# Patient Record
Sex: Female | Born: 1990 | Race: White | Hispanic: No | Marital: Married | Smoking: Current every day smoker
Health system: Southern US, Community
[De-identification: ages and names within clinical notes are randomized; demographics above are authoritative.]

## PROBLEM LIST (undated history)

## (undated) DIAGNOSIS — G43909 Migraine, unspecified, not intractable, without status migrainosus: Secondary | ICD-10-CM

## (undated) DIAGNOSIS — J45909 Unspecified asthma, uncomplicated: Secondary | ICD-10-CM

## (undated) DIAGNOSIS — F419 Anxiety disorder, unspecified: Secondary | ICD-10-CM

## (undated) HISTORY — PX: TONSILLECTOMY: SUR1361

## (undated) HISTORY — DX: Anxiety disorder, unspecified: F41.9

## (undated) HISTORY — DX: Migraine, unspecified, not intractable, without status migrainosus: G43.909

---
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
## 2009-02-01 ENCOUNTER — Observation Stay (HOSPITAL_COMMUNITY): Admission: AD | Admit: 2009-02-01 | Discharge: 2009-02-02 | Payer: Self-pay | Admitting: Obstetrics and Gynecology

## 2009-02-12 ENCOUNTER — Inpatient Hospital Stay (HOSPITAL_COMMUNITY): Admission: AD | Admit: 2009-02-12 | Discharge: 2009-02-14 | Payer: Self-pay | Admitting: Obstetrics and Gynecology

## 2009-02-12 ENCOUNTER — Encounter (INDEPENDENT_AMBULATORY_CARE_PROVIDER_SITE_OTHER): Payer: Self-pay | Admitting: Obstetrics and Gynecology

## 2009-02-21 ENCOUNTER — Inpatient Hospital Stay (HOSPITAL_COMMUNITY): Admission: AD | Admit: 2009-02-21 | Discharge: 2009-02-24 | Payer: Self-pay | Admitting: Obstetrics and Gynecology

## 2009-02-26 ENCOUNTER — Emergency Department (HOSPITAL_COMMUNITY): Admission: EM | Admit: 2009-02-26 | Discharge: 2009-02-26 | Payer: Self-pay | Admitting: Emergency Medicine

## 2010-04-22 ENCOUNTER — Encounter: Payer: Self-pay | Admitting: Obstetrics & Gynecology

## 2010-07-05 LAB — URINALYSIS, ROUTINE W REFLEX MICROSCOPIC
Bilirubin Urine: NEGATIVE
Bilirubin Urine: NEGATIVE
Bilirubin Urine: NEGATIVE
Glucose, UA: NEGATIVE mg/dL
Glucose, UA: NEGATIVE mg/dL
Glucose, UA: NEGATIVE mg/dL
Hgb urine dipstick: NEGATIVE
Ketones, ur: NEGATIVE mg/dL
Ketones, ur: NEGATIVE mg/dL
Ketones, ur: NEGATIVE mg/dL
Ketones, ur: 15 mg/dL — AB
Leukocytes, UA: NEGATIVE
Nitrite: NEGATIVE
Nitrite: NEGATIVE
Nitrite: NEGATIVE
Protein, ur: NEGATIVE mg/dL
Protein, ur: 100 mg/dL — AB
Protein, ur: >300 mg/dL — AB
Protein, ur: 30 mg/dL — AB
Specific Gravity, Urine: 1.015 (ref 1.005–1.030)
Specific Gravity, Urine: 1.02 (ref 1.005–1.030)
Specific Gravity, Urine: 1.025 (ref 1.005–1.030)
Urobilinogen, UA: 0.2 mg/dL (ref 0.0–1.0)
Urobilinogen, UA: 0.2 mg/dL (ref 0.0–1.0)
Urobilinogen, UA: 0.2 mg/dL (ref 0.0–1.0)
Urobilinogen, UA: 0.2 mg/dL (ref 0.0–1.0)
pH: 6 (ref 5.0–8.0)
pH: 6.5 (ref 5.0–8.0)
pH: 7 (ref 5.0–8.0)

## 2010-07-05 LAB — ABO/RH: ABO/RH(D): O POS

## 2010-07-05 LAB — CBC
HCT: 42 % (ref 36.0–46.0)
HCT: 39.2 % (ref 36.0–46.0)
HCT: 42.9 % (ref 36.0–46.0)
HCT: 33 % — ABNORMAL LOW (ref 36.0–46.0)
HCT: 37.3 % (ref 36.0–46.0)
Hemoglobin: 14.5 g/dL (ref 12.0–15.0)
Hemoglobin: 13.3 g/dL (ref 12.0–15.0)
Hemoglobin: 14.6 g/dL (ref 12.0–15.0)
Hemoglobin: 12.9 g/dL (ref 12.0–15.0)
Hemoglobin: 11.4 g/dL — ABNORMAL LOW (ref 12.0–15.0)
MCHC: 34.6 g/dL (ref 30.0–36.0)
MCHC: 33.8 g/dL (ref 30.0–36.0)
MCHC: 33.9 g/dL (ref 30.0–36.0)
MCHC: 35.5 g/dL (ref 30.0–36.0)
MCHC: 34.6 g/dL (ref 30.0–36.0)
MCHC: 34.6 g/dL (ref 30.0–36.0)
MCV: 96.1 fL (ref 78.0–100.0)
MCV: 95.3 fL (ref 78.0–100.0)
MCV: 96.1 fL (ref 78.0–100.0)
MCV: 95.5 fL (ref 78.0–100.0)
Platelets: 338 10*3/uL (ref 150–400)
Platelets: 303 10*3/uL (ref 150–400)
Platelets: 334 10*3/uL (ref 150–400)
Platelets: 132 10*3/uL — ABNORMAL LOW (ref 150–400)
Platelets: 181 10*3/uL (ref 150–400)
Platelets: 196 10*3/uL (ref 150–400)
RBC: 4.37 MIL/uL (ref 3.87–5.11)
RBC: 4.08 MIL/uL (ref 3.87–5.11)
RBC: 4.5 MIL/uL (ref 3.87–5.11)
RBC: 3.88 MIL/uL (ref 3.87–5.11)
RBC: 4.21 MIL/uL (ref 3.87–5.11)
RDW: 13.1 % (ref 11.5–15.5)
RDW: 13.2 % (ref 11.5–15.5)
RDW: 13.5 % (ref 11.5–15.5)
RDW: 13.4 % (ref 11.5–15.5)
RDW: 13.4 % (ref 11.5–15.5)
WBC: 10 10*3/uL (ref 4.0–10.5)
WBC: 7.9 10*3/uL (ref 4.0–10.5)
WBC: 9.1 10*3/uL (ref 4.0–10.5)
WBC: 21.4 10*3/uL — ABNORMAL HIGH (ref 4.0–10.5)

## 2010-07-05 LAB — COMPREHENSIVE METABOLIC PANEL
ALT: 17 U/L (ref 0–35)
ALT: 14 U/L (ref 0–35)
ALT: 17 U/L (ref 0–35)
ALT: 12 U/L (ref 0–35)
ALT: 12 U/L (ref 0–35)
AST: 28 U/L (ref 0–37)
AST: 18 U/L (ref 0–37)
AST: 19 U/L (ref 0–37)
AST: 34 U/L (ref 0–37)
Albumin: 3.8 g/dL (ref 3.5–5.2)
Albumin: 2.8 g/dL — ABNORMAL LOW (ref 3.5–5.2)
Albumin: 3.4 g/dL — ABNORMAL LOW (ref 3.5–5.2)
Albumin: 2.7 g/dL — ABNORMAL LOW (ref 3.5–5.2)
Alkaline Phosphatase: 86 U/L (ref 39–117)
Alkaline Phosphatase: 84 U/L (ref 39–117)
Alkaline Phosphatase: 98 U/L (ref 39–117)
Alkaline Phosphatase: 144 U/L — ABNORMAL HIGH (ref 39–117)
Alkaline Phosphatase: 140 U/L — ABNORMAL HIGH (ref 39–117)
BUN: 14 mg/dL (ref 6–23)
BUN: 6 mg/dL (ref 6–23)
BUN: 9 mg/dL (ref 6–23)
BUN: 5 mg/dL — ABNORMAL LOW (ref 6–23)
CO2: 24 mEq/L (ref 19–32)
CO2: 25 mEq/L (ref 19–32)
CO2: 28 mEq/L (ref 19–32)
CO2: 20 mEq/L (ref 19–32)
Calcium: 9.2 mg/dL (ref 8.4–10.5)
Calcium: 7 mg/dL — ABNORMAL LOW (ref 8.4–10.5)
Calcium: 8.7 mg/dL (ref 8.4–10.5)
Calcium: 8.5 mg/dL (ref 8.4–10.5)
Chloride: 105 mEq/L (ref 96–112)
Chloride: 103 mEq/L (ref 96–112)
Chloride: 105 mEq/L (ref 96–112)
Chloride: 106 mEq/L (ref 96–112)
Creatinine, Ser: 0.64 mg/dL (ref 0.4–1.2)
Creatinine, Ser: 0.6 mg/dL (ref 0.4–1.2)
Creatinine, Ser: 0.63 mg/dL (ref 0.4–1.2)
GFR calc Af Amer: >60 mL/min (ref >60)
GFR calc Af Amer: >60 mL/min (ref >60)
GFR calc Af Amer: >60 mL/min (ref >60)
GFR calc Af Amer: >60 mL/min (ref >60)
GFR calc non Af Amer: >60 mL/min (ref >60)
GFR calc non Af Amer: >60 mL/min (ref >60)
GFR calc non Af Amer: >60 mL/min (ref >60)
GFR calc non Af Amer: >60 mL/min (ref >60)
Glucose, Bld: 102 mg/dL — ABNORMAL HIGH (ref 70–99)
Glucose, Bld: 86 mg/dL (ref 70–99)
Glucose, Bld: 88 mg/dL (ref 70–99)
Glucose, Bld: 97 mg/dL (ref 70–99)
Potassium: 4.8 mEq/L (ref 3.5–5.1)
Potassium: 3.8 mEq/L (ref 3.5–5.1)
Potassium: 4.4 mEq/L (ref 3.5–5.1)
Potassium: 3.4 mEq/L — ABNORMAL LOW (ref 3.5–5.1)
Potassium: 4 mEq/L (ref 3.5–5.1)
Sodium: 137 mEq/L (ref 135–145)
Sodium: 135 mEq/L (ref 135–145)
Sodium: 139 mEq/L (ref 135–145)
Sodium: 137 mEq/L (ref 135–145)
Sodium: 134 mEq/L — ABNORMAL LOW (ref 135–145)
Total Bilirubin: 0.8 mg/dL (ref 0.3–1.2)
Total Bilirubin: 0.2 mg/dL — ABNORMAL LOW (ref 0.3–1.2)
Total Bilirubin: 0.5 mg/dL (ref 0.3–1.2)
Total Bilirubin: 0.4 mg/dL (ref 0.3–1.2)
Total Bilirubin: 0.5 mg/dL (ref 0.3–1.2)
Total Protein: 6.6 g/dL (ref 6.0–8.3)
Total Protein: 5.3 g/dL — ABNORMAL LOW (ref 6.0–8.3)
Total Protein: 6.4 g/dL (ref 6.0–8.3)
Total Protein: 5.4 g/dL — ABNORMAL LOW (ref 6.0–8.3)

## 2010-07-05 LAB — URINE MICROSCOPIC-ADD ON

## 2010-07-05 LAB — DIFFERENTIAL
Basophils Absolute: 0.1 10*3/uL (ref 0.0–0.1)
Basophils Relative: 1 % (ref 0–1)
Eosinophils Absolute: 0.2 10*3/uL (ref 0.0–0.7)
Eosinophils Relative: 2 % (ref 0–5)
Lymphocytes Relative: 25 % (ref 12–46)
Lymphs Abs: 2.5 10*3/uL (ref 0.7–4.0)
Monocytes Absolute: 0.8 10*3/uL (ref 0.1–1.0)
Monocytes Relative: 8 % (ref 3–12)
Neutro Abs: 6.4 10*3/uL (ref 1.7–7.7)
Neutrophils Relative %: 64 % (ref 43–77)

## 2010-07-05 LAB — POCT I-STAT, CHEM 8
Hemoglobin: 12.2 g/dL (ref 12.0–15.0)
Sodium: 138 mEq/L (ref 135–145)
TCO2: 19 mmol/L (ref 0–100)

## 2010-07-05 LAB — URINE CULTURE
Colony Count: 25000
Colony Count: 55000

## 2010-07-05 LAB — GLUCOSE, CAPILLARY: Glucose-Capillary: 97 mg/dL (ref 70–99)

## 2010-07-05 LAB — PROTIME-INR
INR: 0.98 (ref 0.00–1.49)
Prothrombin Time: 12.9 seconds (ref 11.6–15.2)
Prothrombin Time: 13.2 seconds (ref 11.6–15.2)

## 2010-07-05 LAB — TYPE AND SCREEN: Antibody Screen: NEGATIVE

## 2010-07-05 LAB — URIC ACID: Uric Acid, Serum: 5.6 mg/dL (ref 2.4–7.0)

## 2010-07-05 LAB — MAGNESIUM: Magnesium: 5.7 mg/dL — ABNORMAL HIGH (ref 1.5–2.5)

## 2010-07-05 LAB — LACTATE DEHYDROGENASE: LDH: 219 U/L (ref 94–250)

## 2010-07-05 LAB — RPR: RPR Ser Ql: NONREACTIVE
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
## 2012-05-05 ENCOUNTER — Encounter (HOSPITAL_COMMUNITY): Payer: Self-pay | Admitting: Emergency Medicine

## 2012-05-05 ENCOUNTER — Emergency Department (HOSPITAL_COMMUNITY)
Admission: EM | Admit: 2012-05-05 | Discharge: 2012-05-05 | Disposition: A | Discharge status: Home / Self Care | Payer: Self-pay | Attending: Emergency Medicine | Admitting: Emergency Medicine

## 2012-05-05 DIAGNOSIS — R110 Nausea: Secondary | ICD-10-CM | POA: Insufficient documentation

## 2012-05-05 DIAGNOSIS — F17200 Nicotine dependence, unspecified, uncomplicated: Secondary | ICD-10-CM | POA: Insufficient documentation

## 2012-05-05 DIAGNOSIS — R300 Dysuria: Secondary | ICD-10-CM | POA: Insufficient documentation

## 2012-05-05 DIAGNOSIS — L293 Anogenital pruritus, unspecified: Secondary | ICD-10-CM | POA: Insufficient documentation

## 2012-05-05 DIAGNOSIS — R3915 Urgency of urination: Secondary | ICD-10-CM | POA: Insufficient documentation

## 2012-05-05 DIAGNOSIS — R350 Frequency of micturition: Secondary | ICD-10-CM | POA: Insufficient documentation

## 2012-05-05 DIAGNOSIS — N390 Urinary tract infection, site not specified: Secondary | ICD-10-CM

## 2012-05-05 DIAGNOSIS — Z79899 Other long term (current) drug therapy: Secondary | ICD-10-CM | POA: Insufficient documentation

## 2012-05-05 DIAGNOSIS — J45909 Unspecified asthma, uncomplicated: Secondary | ICD-10-CM | POA: Insufficient documentation

## 2012-05-05 DIAGNOSIS — Z3202 Encounter for pregnancy test, result negative: Secondary | ICD-10-CM | POA: Insufficient documentation

## 2012-05-05 DIAGNOSIS — R109 Unspecified abdominal pain: Secondary | ICD-10-CM | POA: Insufficient documentation

## 2012-05-05 DIAGNOSIS — B9689 Other specified bacterial agents as the cause of diseases classified elsewhere: Secondary | ICD-10-CM

## 2012-05-05 DIAGNOSIS — N760 Acute vaginitis: Secondary | ICD-10-CM | POA: Insufficient documentation

## 2012-05-05 HISTORY — DX: Unspecified asthma, uncomplicated: J45.909

## 2012-05-05 LAB — URINALYSIS, ROUTINE W REFLEX MICROSCOPIC
Bilirubin Urine: NEGATIVE
Hgb urine dipstick: NEGATIVE
Ketones, ur: NEGATIVE mg/dL
Nitrite: NEGATIVE
Urobilinogen, UA: 0.2 mg/dL (ref 0.0–1.0)

## 2012-05-05 LAB — WET PREP, GENITAL

## 2012-05-05 LAB — URINE MICROSCOPIC-ADD ON

## 2012-05-05 MED ORDER — METRONIDAZOLE 500 MG PO TABS: 500.0000 mg | ORAL_TABLET | Freq: Two times a day (BID) | ORAL | Status: DC

## 2012-05-05 MED ORDER — NITROFURANTOIN MONOHYD MACRO 100 MG PO CAPS: 100.0000 mg | ORAL_CAPSULE | Freq: Two times a day (BID) | ORAL | Status: DC

## 2012-05-05 NOTE — ED Notes (Signed)
Pt reports vaginal discharge and itching X2w, also reports bilat flank and abd pain, no vomiting, no fever, c/o nausea, also chills and, NAD

## 2012-05-05 NOTE — ED Notes (Signed)
Did not complete pregnancy urine.  It was charted under my name.

## 2012-05-05 NOTE — ED Provider Notes (Signed)
History     CSN: [id number]  Arrival date & time 05/05/12  1007   First MD Initiated Contact with Patient 05/05/12 1010      Chief Complaint  Patient presents with  . Vaginal Discharge  . Vaginal Itching    (Consider location/radiation/quality/duration/timing/severity/associated sxs/prior treatment) HPI Comments: Patient presents today with a chief complaint of vaginal discharge and vaginal itching for the past 2 weeks.  She reports that the discharge is thick and white in color.  She is also complaining of suprapubic abdominal pain.  Pain does not radiate.  Pain gradually worsening.  She has not taken anything for her symptoms.  She has also had dysuria, increased urinary frequency, and urgency over the past week.  She denies fever or chills.  She has had intermittent nausea, but no vomiting.  She has had intermittent bilateral flank pain.  No flank pain at this time.  No hematuria.  No diarrhea.  She is having regular bowel movements.  She is currently sexually active with her husband.  No prior history of STD's.  No history of kidney stones.  She does have history of UTI's.   The history is provided by the patient.    Past Medical History  Diagnosis Date  . Asthma     Past Surgical History  Procedure Date  . Tonsillectomy     No family history on file.  History  Substance Use Topics  . Smoking status: Current Every Day Smoker  . Smokeless tobacco: Not on file  . Alcohol Use: No    OB History    Grav Para Term Preterm Abortions TAB SAB Ect Mult Living                  Review of Systems  Constitutional: Negative for fever and chills.  Gastrointestinal: Positive for nausea and abdominal pain. Negative for vomiting, diarrhea, constipation, blood in stool and abdominal distention.  Genitourinary: Positive for dysuria, urgency, frequency and vaginal discharge. Negative for vaginal bleeding, difficulty urinating and vaginal pain.       Vaginal itching  Neurological:  Negative for headaches.  All other systems reviewed and are negative.    Allergies  Percocet  Home Medications   Current Outpatient Rx  Name  Route  Sig  Dispense  Refill  . MENSTRUAL PAIN RELIEF PO   Oral   Take 1 tablet by mouth daily as needed. For menstrual cramps.         Marland Kitchen CIPROFLOXACIN HCL 500 MG PO TABS   Oral   Take 500 mg by mouth daily. Started Monday 04/28/12.         Marland Kitchen NAPROXEN SODIUM 220 MG PO TABS   Oral   Take 440 mg by mouth 2 (two) times daily with a meal. For pain.           BP 134/89  Pulse 105  Temp 97.4 F (36.3 C) (Oral)  Resp 16  SpO2 99%  Physical Exam  Nursing note and vitals reviewed. Constitutional: She appears well-developed and well-nourished. No distress.  HENT:  Head: Normocephalic and atraumatic.  Mouth/Throat: Oropharynx is clear and moist.  Neck: Normal range of motion. Neck supple.  Cardiovascular: Normal rate, regular rhythm and normal heart sounds.   Pulmonary/Chest: Effort normal and breath sounds normal.  Abdominal: Soft. Normal appearance and bowel sounds are normal. She exhibits no distension and no mass. There is no rigidity, no rebound, no guarding and no CVA tenderness.  Mild suprapubic tenderness to palpation  Musculoskeletal: Normal range of motion.  Neurological: She is alert.  Skin: Skin is warm and dry. She is not diaphoretic.  Psychiatric: She has a normal mood and affect.    ED Course  Procedures (including critical care time)  Labs Reviewed  URINALYSIS, ROUTINE W REFLEX MICROSCOPIC - Abnormal; Notable for the following:    APPearance CLOUDY (*)     Leukocytes, UA LARGE (*)     All other components within normal limits  URINE MICROSCOPIC-ADD ON - Abnormal; Notable for the following:    Squamous Epithelial / LPF MANY (*)     Bacteria, UA MANY (*)     All other components within normal limits  POCT PREGNANCY, URINE  URINE CULTURE  WET PREP, GENITAL  GC/CHLAMYDIA PROBE AMP   No results  found.   No diagnosis found.  Patient able to tolerate PO liquids.    MDM  Patient presenting with urinary symptoms along with suprapubic abdominal pain.  UA showing a UTI.  Urine sent for culture.  Patient with no CVA tenderness on exam.  Patient afebrile.  Therefore, feel that Pyelonephritis is unlikely.  Patient given Rx for Macrobid.  Wet prep shows BV.  Patient also treated with Flagyl.  Return precautions given to the patient.  Patient in agreement with the plan.        Pascal Lux [place], PA-C 05/05/12 1945

## 2012-05-05 NOTE — ED Notes (Signed)
Care transferred and report given to Alan, RN  

## 2012-05-06 LAB — URINE CULTURE: Culture: NO GROWTH

## 2012-05-06 LAB — GC/CHLAMYDIA PROBE AMP: GC Probe RNA: NEGATIVE

## 2012-05-06 NOTE — ED Provider Notes (Signed)
Medical screening examination/treatment/procedure(s) were performed by non-physician practitioner and as supervising physician I was immediately available for consultation/collaboration.   Flint Melter, MD 05/06/12 1550

## 2014-02-08 ENCOUNTER — Other Ambulatory Visit: Payer: Self-pay | Admitting: Obstetrics and Gynecology

## 2014-02-09 LAB — CYTOLOGY - PAP

## 2016-11-13 ENCOUNTER — Encounter: Payer: Self-pay | Admitting: Neurology

## 2016-11-13 ENCOUNTER — Encounter (HOSPITAL_COMMUNITY): Payer: Self-pay | Admitting: Emergency Medicine

## 2016-11-13 ENCOUNTER — Emergency Department (HOSPITAL_COMMUNITY): Payer: BLUE CROSS/BLUE SHIELD

## 2016-11-13 ENCOUNTER — Emergency Department (HOSPITAL_COMMUNITY)
Admission: EM | Admit: 2016-11-13 | Discharge: 2016-11-13 | Disposition: A | Discharge status: Home / Self Care | Payer: BLUE CROSS/BLUE SHIELD | Attending: Emergency Medicine | Admitting: Emergency Medicine

## 2016-11-13 DIAGNOSIS — R51 Headache: Secondary | ICD-10-CM | POA: Insufficient documentation

## 2016-11-13 DIAGNOSIS — Z79899 Other long term (current) drug therapy: Secondary | ICD-10-CM | POA: Diagnosis not present

## 2016-11-13 DIAGNOSIS — J45909 Unspecified asthma, uncomplicated: Secondary | ICD-10-CM | POA: Insufficient documentation

## 2016-11-13 DIAGNOSIS — R519 Headache, unspecified: Secondary | ICD-10-CM

## 2016-11-13 DIAGNOSIS — F17200 Nicotine dependence, unspecified, uncomplicated: Secondary | ICD-10-CM | POA: Diagnosis not present

## 2016-11-13 LAB — COMPREHENSIVE METABOLIC PANEL
ALBUMIN: 4.2 g/dL (ref 3.5–5.0)
ALT: 26 U/L (ref 14–54)
AST: 24 U/L (ref 15–41)
Alkaline Phosphatase: 65 U/L (ref 38–126)
Anion gap: 9 (ref 5–15)
BILIRUBIN TOTAL: 0.4 mg/dL (ref 0.3–1.2)
BUN: 7 mg/dL (ref 6–20)
CO2: 21 mmol/L — ABNORMAL LOW (ref 22–32)
CREATININE: 0.77 mg/dL (ref 0.44–1.00)
Calcium: 9.1 mg/dL (ref 8.9–10.3)
Chloride: 106 mmol/L (ref 101–111)
GFR calc Af Amer: >60 mL/min (ref >60)
GFR calc non Af Amer: >60 mL/min (ref >60)
GLUCOSE: 105 mg/dL — AB (ref 65–99)
Potassium: 3.6 mmol/L (ref 3.5–5.1)
Sodium: 136 mmol/L (ref 135–145)
TOTAL PROTEIN: 6.7 g/dL (ref 6.5–8.1)

## 2016-11-13 LAB — CBC WITH DIFFERENTIAL/PLATELET
BASOS PCT: 1 %
Basophils Absolute: 0 10*3/uL (ref 0.0–0.1)
Eosinophils Absolute: 0.1 10*3/uL (ref 0.0–0.7)
Eosinophils Relative: 1 %
HEMATOCRIT: 39.6 % (ref 36.0–46.0)
HEMOGLOBIN: 13.7 g/dL (ref 12.0–15.0)
LYMPHS PCT: 39 %
Lymphs Abs: 3.3 10*3/uL (ref 0.7–4.0)
MCH: 29.1 pg (ref 26.0–34.0)
MCHC: 34.6 g/dL (ref 30.0–36.0)
MCV: 84.3 fL (ref 78.0–100.0)
MONOS PCT: 7 %
Monocytes Absolute: 0.6 10*3/uL (ref 0.1–1.0)
NEUTROS ABS: 4.4 10*3/uL (ref 1.7–7.7)
Neutrophils Relative %: 52 %
Platelets: 357 10*3/uL (ref 150–400)
RBC: 4.7 MIL/uL (ref 3.87–5.11)
RDW: 12.5 % (ref 11.5–15.5)
WBC: 8.5 10*3/uL (ref 4.0–10.5)

## 2016-11-13 LAB — URINALYSIS, ROUTINE W REFLEX MICROSCOPIC
BACTERIA UA: NONE SEEN
Bilirubin Urine: NEGATIVE
Glucose, UA: NEGATIVE mg/dL
KETONES UR: NEGATIVE mg/dL
Nitrite: NEGATIVE
PH: 7 (ref 5.0–8.0)
Protein, ur: NEGATIVE mg/dL
Specific Gravity, Urine: 1.003 — ABNORMAL LOW (ref 1.005–1.030)

## 2016-11-13 LAB — I-STAT BETA HCG BLOOD, ED (MC, WL, AP ONLY): I-stat hCG, quantitative: <5 m[IU]/mL (ref <5)

## 2016-11-13 LAB — D-DIMER, QUANTITATIVE: D-Dimer, Quant: <0.27 ug/mL-FEU (ref 0.00–0.50)

## 2016-11-13 MED ORDER — DIPHENHYDRAMINE HCL 50 MG/ML IJ SOLN
25.0000 mg | Freq: Once | INTRAMUSCULAR | Status: AC
  Administered 2016-11-13: 25 mg via INTRAVENOUS
  Filled 2016-11-13: qty 1

## 2016-11-13 MED ORDER — KETOROLAC TROMETHAMINE 15 MG/ML IJ SOLN
15.0000 mg | Freq: Once | INTRAMUSCULAR | Status: AC
  Administered 2016-11-13: 15 mg via INTRAVENOUS
  Filled 2016-11-13: qty 1

## 2016-11-13 MED ORDER — METOCLOPRAMIDE HCL 5 MG/ML IJ SOLN
10.0000 mg | Freq: Once | INTRAMUSCULAR | Status: AC
  Administered 2016-11-13: 10 mg via INTRAVENOUS
  Filled 2016-11-13: qty 2

## 2016-11-13 NOTE — ED Provider Notes (Signed)
Sign out from [employer] Browning, PA-C at shift change  Headache x 3 weeks; weird nonspecific neuro complaints, HA cocktail; also chest pain and tachycardia dx by PCP started on BB; D-dimer pending; if normal, d/c home to neuro for further evaluation  D-dimer is negative and patient's headache improved after headache cocktail. We'll discharge patient home with follow-up to neurology here and given resources. Patient discharged in satisfactory condition.    Destiny HolesLaw, Destiny Oyster M, PA-C 11/13/16 [id number]    Destiny Friedman, Donald, MD 11/14/16 [phone]

## 2016-11-13 NOTE — ED Notes (Addendum)
Brought pt back to room and pt stated that while she was waiting for a room she began having chest pain and became nauseous. Pt stated that symptoms are still there just not as bad. Pt hooked up to 12 lead and ekg performed

## 2016-11-13 NOTE — ED Triage Notes (Signed)
Pt c/o headache x's 3 weeks st's worse today with neck feeling stiff.  Pt also c/o pain behind bil knees and a tightening feeling in bil arms and legs.  Nausea without vomiting

## 2016-11-13 NOTE — ED Provider Notes (Signed)
[facility]-EMERGENCY DEPT Provider Note   CSN: [id number] Arrival date & time: 11/13/16  0129     History   Chief Complaint Chief Complaint  Patient presents with  . Headache    HPI Destiny Friedman is a 26 y.o. female.  Patient presents to the ED with a chief complaint of headache.  She reports having a headache x 3 weeks.  She states that it is throbbing and located behind her left eye.  This is a new problem.  She reports intermittent "tightness" in her arms and legs.  She also reports some intermittent numbness in her finger tips.  She states that she was recently diagnosed with tachycardia and was placed on Propranolol by her PCP.  She states that she has had some chest pain as well as nausea.  She reports that she is concerned about "blood clots."  She denies any history of PE/DVT.  Denies any lower extremity swelling.  There are no other associated symptoms.  She has tried taking migraine medication with no relief.   The history is provided by the patient. No language interpreter was used.    Past Medical History:  Diagnosis Date  . Asthma     There are no active problems to display for this patient.   Past Surgical History:  Procedure Laterality Date  . TONSILLECTOMY      OB History    No data available       Home Medications    Prior to Admission medications   Medication Sig Start Date End Date Taking? Authorizing Provider  busPIRone (BUSPAR) 15 MG tablet Take 15 mg by mouth 2 (two) times daily.   Yes [provider]  cephALEXin (KEFLEX) 500 MG capsule Take 500 mg by mouth 4 (four) times daily.   Yes [provider]  metoprolol tartrate (LOPRESSOR) 50 MG tablet Take 25 mg by mouth 2 (two) times daily.   Yes [provider]  topiramate (TOPAMAX) 25 MG tablet Take 25 mg by mouth 2 (two) times daily.   Yes [provider]  metroNIDAZOLE (FLAGYL) 500 MG tablet Take 1 tablet (500 mg total) by mouth 2 (two) times daily. Patient not  taking: Reported on 11/13/2016 05/05/12   Santiago GladLaisure, Heather, PA-C  nitrofurantoin, macrocrystal-monohydrate, (MACROBID) 100 MG capsule Take 1 capsule (100 mg total) by mouth 2 (two) times daily. Patient not taking: Reported on 11/13/2016 05/05/12   Santiago GladLaisure, Heather, PA-C    Family History No family history on file.  Social History Social History  Substance Use Topics  . Smoking status: Current Every Day Smoker  . Smokeless tobacco: Never Used  . Alcohol use No     Allergies   Percocet [oxycodone-acetaminophen]   Review of Systems Review of Systems  All other systems reviewed and are negative.    Physical Exam Updated Vital Signs BP (!) 118/92   Pulse 84   Temp 98.2 F (36.8 C) (Oral)   Resp 16   Ht 5\' 4"  (1.626 m)   Wt 71.7 kg (158 lb)   LMP 10/31/2016 (Exact Date)   SpO2 99%   BMI 27.12 kg/m   Physical Exam  Constitutional: She is oriented to person, place, and time. She appears well-developed and well-nourished.  HENT:  Head: Normocephalic and atraumatic.  Right Ear: External ear normal.  Left Ear: External ear normal.  Eyes: Pupils are equal, round, and reactive to light. Conjunctivae and EOM are normal.  Neck: Normal range of motion. Neck supple.  No pain with  neck flexion, no meningismus  Cardiovascular: Normal rate, regular rhythm and normal heart sounds.  Exam reveals no gallop and no friction rub.   No murmur heard. Pulmonary/Chest: Effort normal and breath sounds normal. No respiratory distress. She has no wheezes. She has no rales. She exhibits no tenderness.  Abdominal: Soft. She exhibits no distension and no mass. There is no tenderness. There is no rebound and no guarding.  Musculoskeletal: Normal range of motion. She exhibits no edema or tenderness.  Normal gait.  Neurological: She is alert and oriented to person, place, and time. She has normal reflexes.  CN 3-12 intact, normal finger to nose, no pronator drift, sensation and strength intact  bilaterally.  Skin: Skin is warm and dry.  Psychiatric: She has a normal mood and affect. Her behavior is normal. Judgment and thought content normal.  Nursing note and vitals reviewed.    ED Treatments / Results  Labs (all labs ordered are listed, but only abnormal results are displayed) Labs Reviewed  URINALYSIS, ROUTINE W REFLEX MICROSCOPIC - Abnormal; Notable for the following:       Result Value   Color, Urine STRAW (*)    Specific Gravity, Urine 1.003 (*)    Hgb urine dipstick SMALL (*)    Leukocytes, UA SMALL (*)    Squamous Epithelial / LPF 0-5 (*)    All other components within normal limits  COMPREHENSIVE METABOLIC PANEL - Abnormal; Notable for the following:    CO2 21 (*)    Glucose, Bld 105 (*)    All other components within normal limits  CBC WITH DIFFERENTIAL/PLATELET  D-DIMER, QUANTITATIVE (NOT AT [facility])  I-STAT BETA HCG BLOOD, ED ([facility], [facility], [facility] ONLY)    EKG  EKG Interpretation  Date/Time:  Tuesday November 13 2016 04:12:20 EDT Ventricular Rate:  81 PR Interval:    QRS Duration: 87 QT Interval:  368 QTC Calculation: 428 R Axis:   69 Text Interpretation:  Sinus rhythm Interpretation limited secondary to artifact Confirmed by Zadie Rhine ([id number]) on 11/13/2016 4:26:26 AM       Radiology No results found.  Procedures Procedures (including critical care time)  Medications Ordered in ED Medications  ketorolac (TORADOL) 15 MG/ML injection 15 mg (not administered)  diphenhydrAMINE (BENADRYL) injection 25 mg (not administered)  metoCLOPramide (REGLAN) injection 10 mg (not administered)     Initial Impression / Assessment and Plan / ED Course  I have reviewed the triage vital signs and the nursing notes.  Pertinent labs & imaging results that were available during my care of the patient were reviewed by me and considered in my medical decision making (see chart for details).     Patient with headache intermittently x 3 weeks.  Never had CT scan.   Will check CT today to rule out mass or tumor.    CT is normal.  Will give headache cocktail.  Patient has also had some chest pain and was recently diagnosed with tachycardia.  She is low risk for PE, but given her concern for PE, will check d-dimer.    Patient signed out to Law, PA-C, who will continue care.  Final Clinical Impressions(s) / ED Diagnoses   Final diagnoses:  None    New Prescriptions New Prescriptions   No medications on file     Roxy Horseman, Cordelia Poche 11/13/16 6045    Zadie Rhine, MD 11/13/16 [phone]

## 2016-11-13 NOTE — Discharge Instructions (Signed)
You need to be seen by a neurologist or headache specialist.  Your CT scan was normal today, but you may require additional workup in order to diagnose your headaches.  Please return to the ER if your symptoms worsen.

## 2016-11-13 NOTE — ED Notes (Signed)
Patient transported to CT 

## 2017-02-15 ENCOUNTER — Ambulatory Visit: Payer: BLUE CROSS/BLUE SHIELD | Admitting: Neurology

## 2018-12-25 IMAGING — CT CT HEAD W/O CM
4 series · 17 of 47 positions shown, 19 images · non-contrast
Comparison: None.

CLINICAL DATA: 26 y/o F; intermittent headache for 2 weeks with
some nausea.

EXAM:
CT HEAD WITHOUT CONTRAST
TECHNIQUE: Contiguous axial images were obtained from the base of the skull
through the vertex without intravenous contrast.

[Series 3: head wo · axial · 0.40mm/px · z∈[-144,-24]mm · 7 of 32 slices shown, 9 images]
[im 4/32  brain]
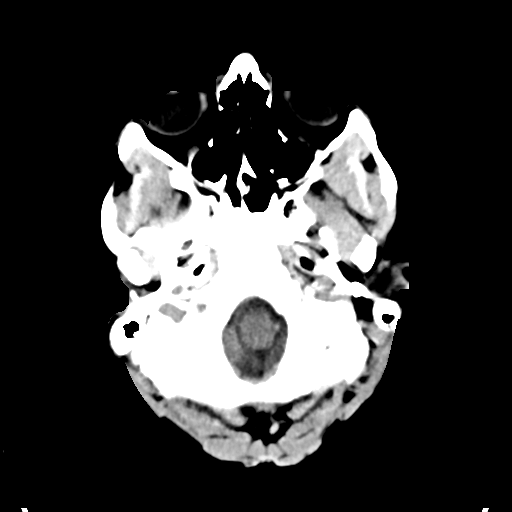
[im 4/32  bone]
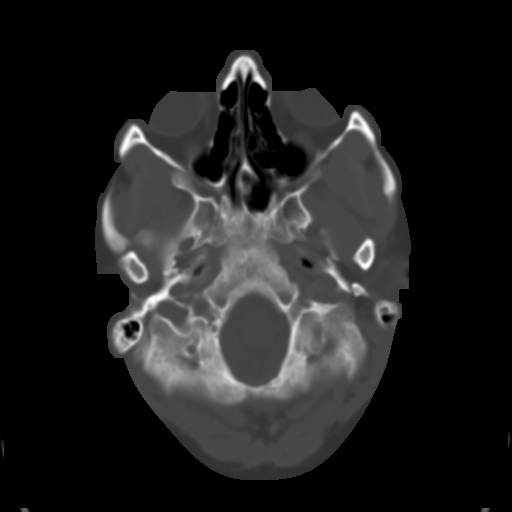
[im 8/32  brain]
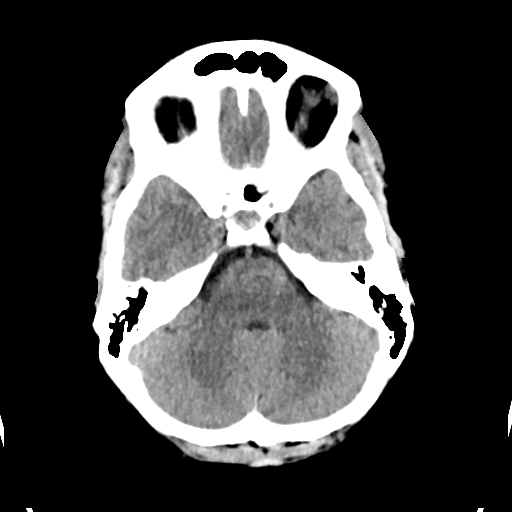
[im 12/32  brain]
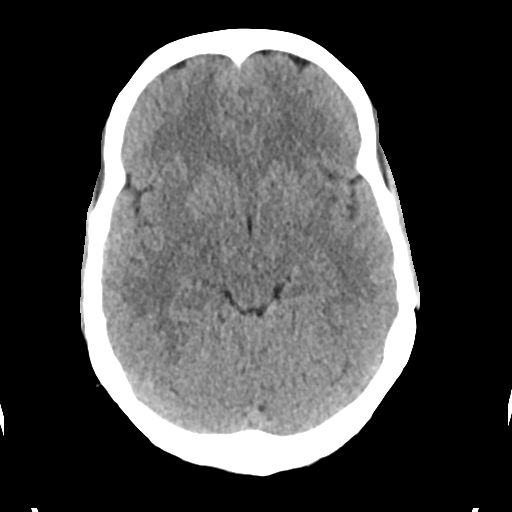
[im 16/32  brain]
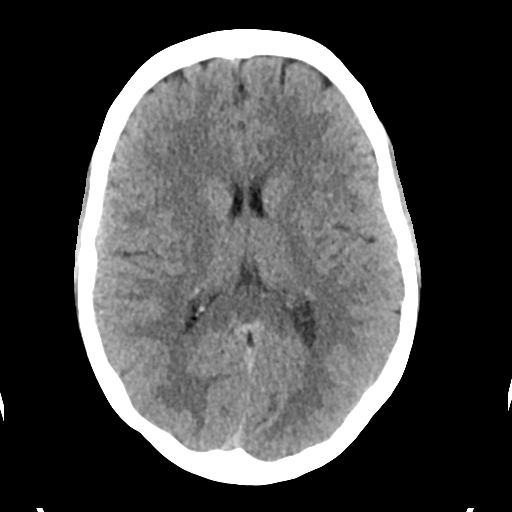
[im 20/32  brain]
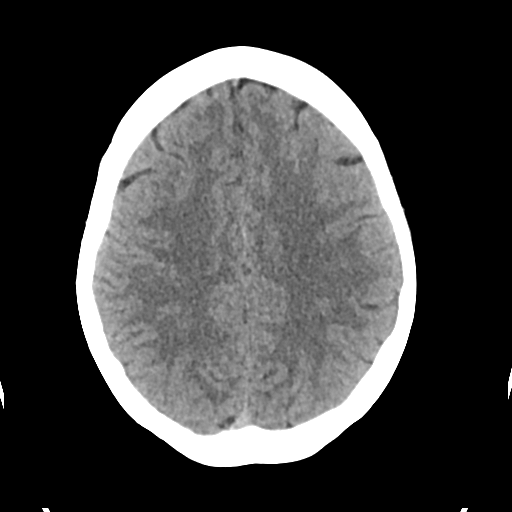
[im 20/32  bone]
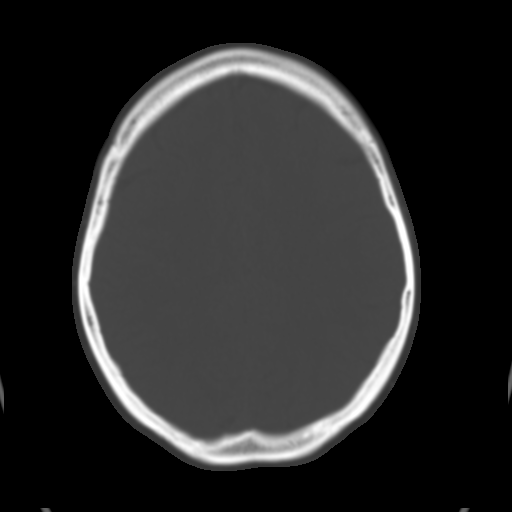
[im 24/32  brain]
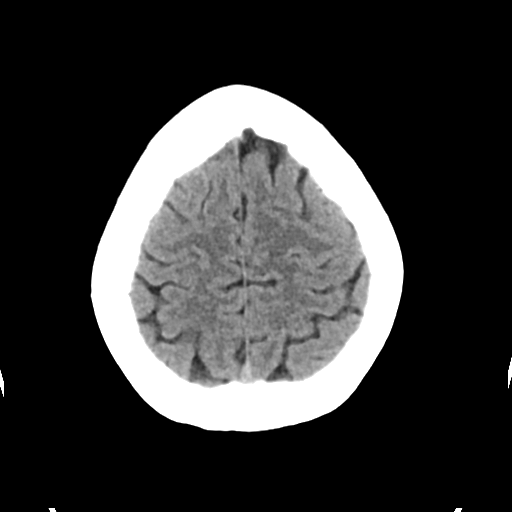
[im 28/32  brain]
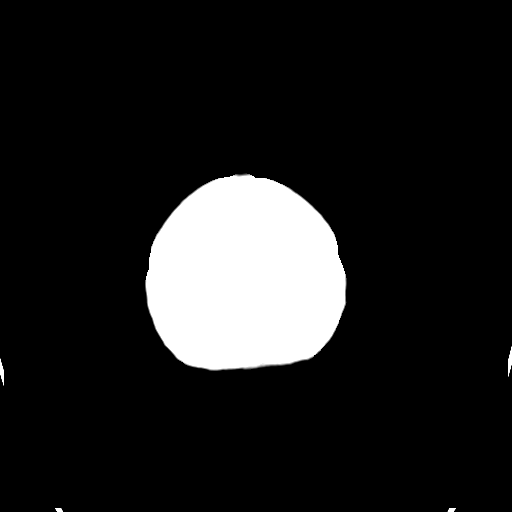

[Series 4: head bone · axial · 0.40mm/px · z∈[-144,-88]mm · 4 of 80 slices shown]
[im 8/80  bone]
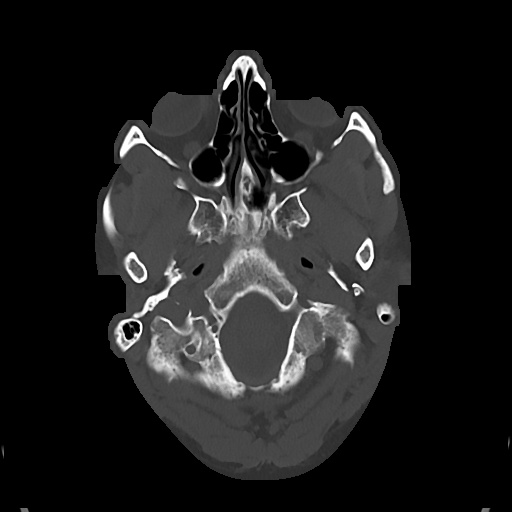
[im 16/80  bone]
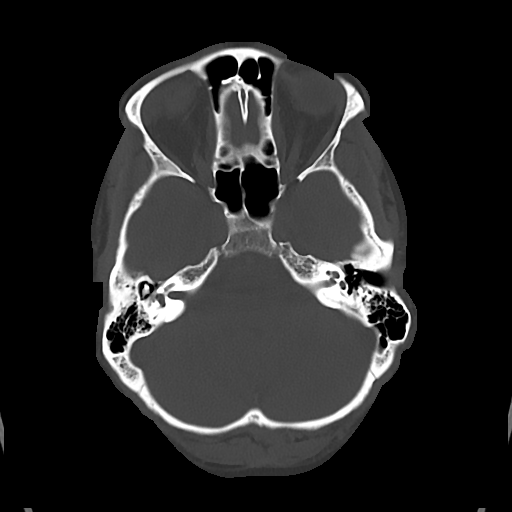
[im 24/80  bone]
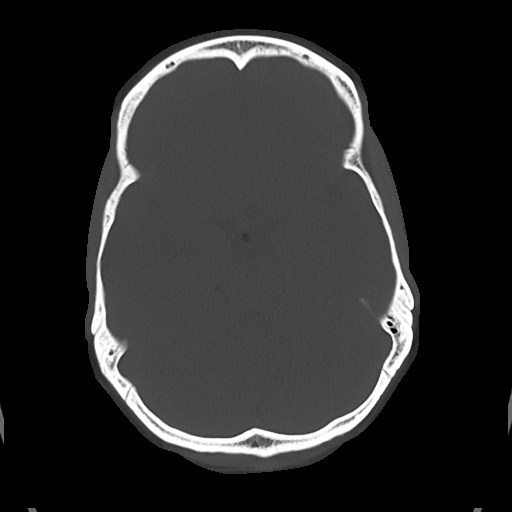
[im 36/80  bone]
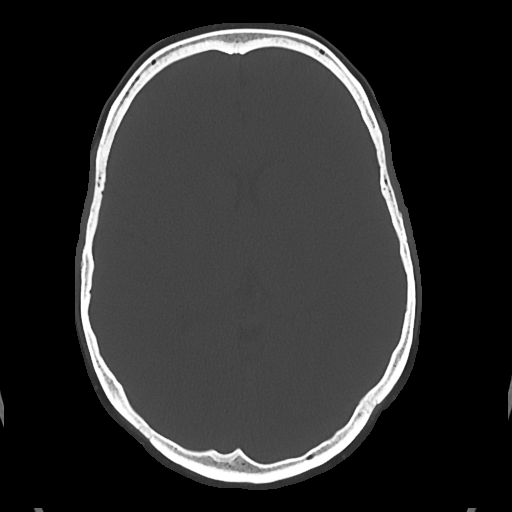

[Series 5: cor soft · coronal · 0.31mm/px · 3 of 70 slices shown]
[im 24/70  brain]
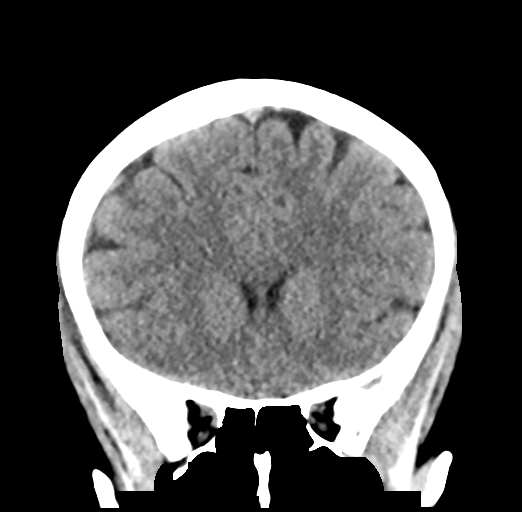
[im 31/70  brain]
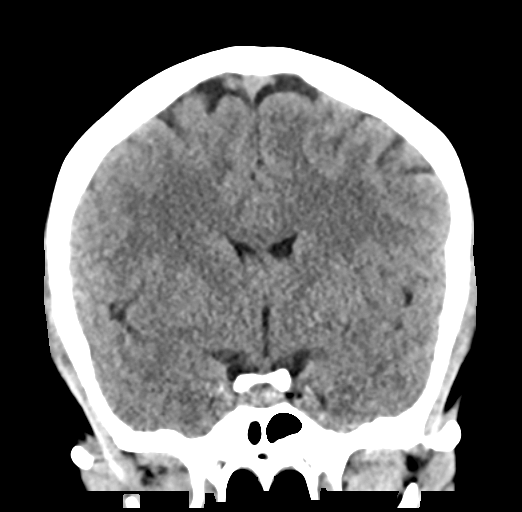
[im 39/70  brain]
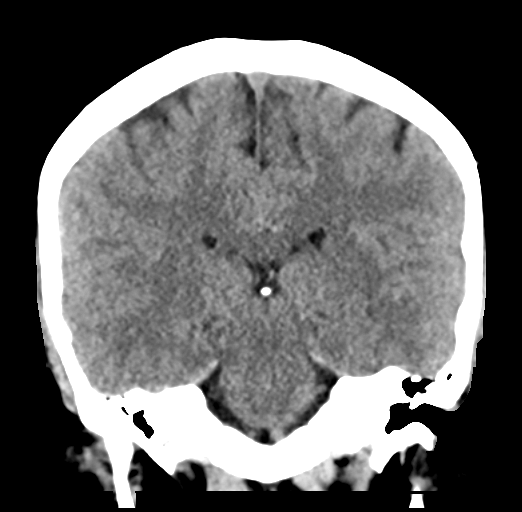

[Series 6: sag soft · sagittal · 0.31mm/px · 3 of 56 slices shown]
[im 19/56  brain]
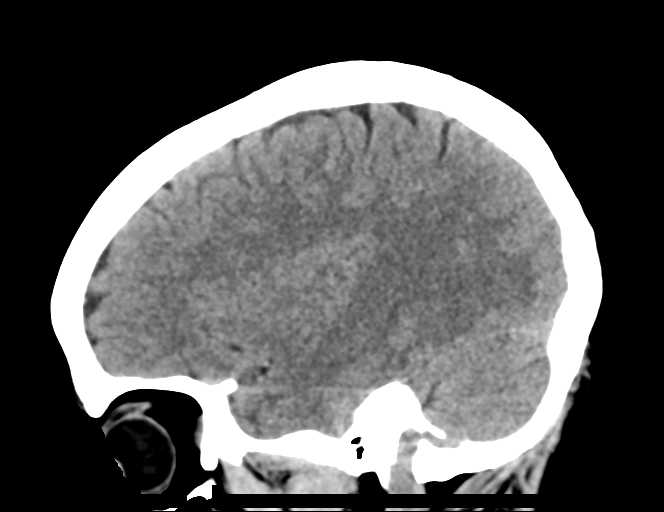
[im 28/56  brain]
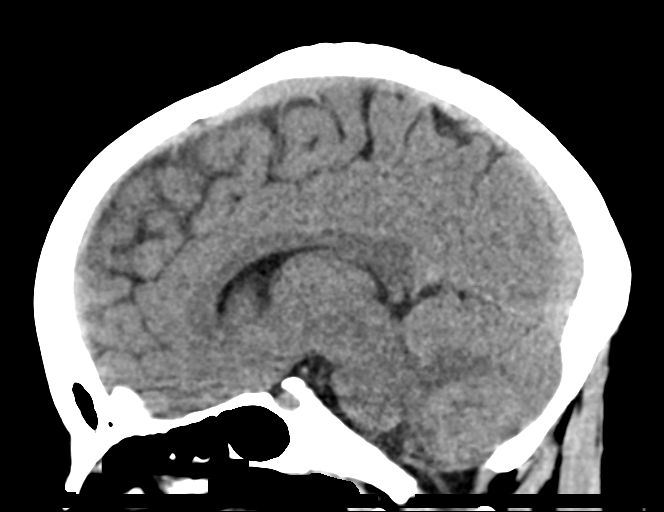
[im 37/56  brain]
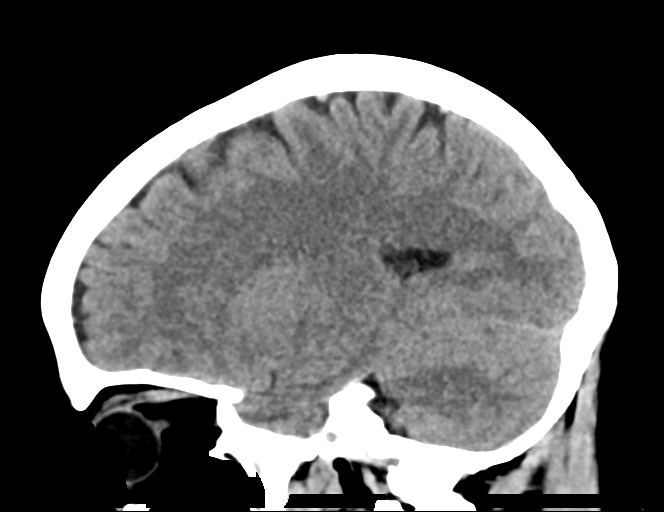

[17 of 47 positions shown; findings below may reference images not displayed]

FINDINGS: Brain: No evidence of acute infarction, hemorrhage, hydrocephalus,
extra-axial collection or mass lesion/mass effect.

Vascular: No hyperdense vessel or unexpected calcification.

Skull: Normal. Negative for fracture or focal lesion.

Sinuses/Orbits: No acute finding.

Other: None.
IMPRESSION: Normal CT of the head.

By: Roberto Tiger M.D.

## 2020-06-01 DIAGNOSIS — G43909 Migraine, unspecified, not intractable, without status migrainosus: Secondary | ICD-10-CM | POA: Diagnosis not present

## 2020-06-01 DIAGNOSIS — F32A Depression, unspecified: Secondary | ICD-10-CM | POA: Diagnosis not present

## 2020-06-01 DIAGNOSIS — Z1339 Encounter for screening examination for other mental health and behavioral disorders: Secondary | ICD-10-CM | POA: Diagnosis not present

## 2020-06-01 DIAGNOSIS — F419 Anxiety disorder, unspecified: Secondary | ICD-10-CM | POA: Diagnosis not present

## 2020-06-10 DIAGNOSIS — G43019 Migraine without aura, intractable, without status migrainosus: Secondary | ICD-10-CM | POA: Diagnosis not present

## 2020-06-10 DIAGNOSIS — G43909 Migraine, unspecified, not intractable, without status migrainosus: Secondary | ICD-10-CM | POA: Diagnosis not present

## 2020-06-29 DIAGNOSIS — F419 Anxiety disorder, unspecified: Secondary | ICD-10-CM | POA: Diagnosis not present

## 2020-06-29 DIAGNOSIS — F32A Depression, unspecified: Secondary | ICD-10-CM | POA: Diagnosis not present

## 2020-06-29 DIAGNOSIS — Z5181 Encounter for therapeutic drug level monitoring: Secondary | ICD-10-CM | POA: Diagnosis not present

## 2021-03-13 DIAGNOSIS — R100 Acute abdomen: Secondary | ICD-10-CM | POA: Diagnosis not present

## 2021-03-13 DIAGNOSIS — Z6831 Body mass index (BMI) 31.0-31.9, adult: Secondary | ICD-10-CM | POA: Diagnosis not present

## 2021-03-13 DIAGNOSIS — R8290 Unspecified abnormal findings in urine: Secondary | ICD-10-CM | POA: Diagnosis not present

## 2021-03-15 DIAGNOSIS — R109 Unspecified abdominal pain: Secondary | ICD-10-CM | POA: Diagnosis not present

## 2021-03-20 DIAGNOSIS — R100 Acute abdomen: Secondary | ICD-10-CM | POA: Diagnosis not present

## 2021-03-20 DIAGNOSIS — G43909 Migraine, unspecified, not intractable, without status migrainosus: Secondary | ICD-10-CM | POA: Diagnosis not present

## 2021-06-28 DIAGNOSIS — Z6831 Body mass index (BMI) 31.0-31.9, adult: Secondary | ICD-10-CM | POA: Diagnosis not present

## 2021-06-28 DIAGNOSIS — R079 Chest pain, unspecified: Secondary | ICD-10-CM | POA: Diagnosis not present

## 2021-06-28 DIAGNOSIS — R002 Palpitations: Secondary | ICD-10-CM | POA: Diagnosis not present

## 2021-10-17 ENCOUNTER — Encounter: Payer: BC Managed Care – PPO | Admitting: Nurse Practitioner

## 2021-10-26 ENCOUNTER — Ambulatory Visit (INDEPENDENT_AMBULATORY_CARE_PROVIDER_SITE_OTHER): Payer: BC Managed Care – PPO | Admitting: Nurse Practitioner

## 2021-10-26 ENCOUNTER — Other Ambulatory Visit (HOSPITAL_COMMUNITY)
Admission: RE | Admit: 2021-10-26 | Discharge: 2021-10-26 | Disposition: A | Discharge status: Home / Self Care | Payer: BC Managed Care – PPO | Source: Ambulatory Visit | Attending: Nurse Practitioner | Admitting: Nurse Practitioner

## 2021-10-26 ENCOUNTER — Encounter: Payer: Self-pay | Admitting: Nurse Practitioner

## 2021-10-26 VITALS — BP 102/72 | HR 83 | Ht 64.25 in | Wt 172.0 lb

## 2021-10-26 DIAGNOSIS — Z01419 Encounter for gynecological examination (general) (routine) without abnormal findings: Secondary | ICD-10-CM | POA: Insufficient documentation

## 2021-10-26 DIAGNOSIS — R102 Pelvic and perineal pain: Secondary | ICD-10-CM

## 2021-10-26 DIAGNOSIS — N921 Excessive and frequent menstruation with irregular cycle: Secondary | ICD-10-CM

## 2021-10-26 NOTE — Progress Notes (Signed)
Destiny Friedman Jul 03, 1990 [id number]   History:  31 y.o. G1P1001 presents as new patient to establish care and discuss menses and pelvic pain. She began having intermittent low abdominal pain back in November 2022, abdominal ultrasound normal, pelvic ultrasound not done. Pain occurs most months 1-2 weeks after menses, is sharp, mostly left-sided, and can last hours to days. She recently was at [place] and had excruciating pain that radiated down left leg. Menses have become somewhat irregular sometimes occurring 1-2 weeks early, are very heavy with clots for 3-4 days, clots as big as palm of hand at times. Prior to this she had monthly cycles with moderate bleeding. H/O ovarian cyst, migraines. Normal pap history.    Gynecologic History Patient's last menstrual period was 10/16/2021 (exact date). Period Cycle (Days):  (has been varying lately but notes that daughter just started her cycle as well. Reports they can come earlier by 1-2 weeks.) Period Duration (Days): 7 Menstrual Flow: Heavy (heavy for 4-5 days then lightens up) Menstrual Control: Maxi pad Dysmenorrhea: (!) Moderate (moderate to severe) Dysmenorrhea Symptoms: Cramping, Nausea Contraception/Family planning: none Sexually active: Yes  Health Maintenance Last Pap: 02/08/2014. Results were: Normal Last mammogram: Not indicated Last colonoscopy: Not indicated Last Dexa: Not indicated  Past medical history, past surgical history, family history and social history were all reviewed and documented in the EPIC chart. Married. Mortgage loan closer, works remote. 93 yo daughter.   ROS:  A ROS was performed and pertinent positives and negatives are included.  Exam:  Vitals:   10/26/21 1457  BP: 102/72  Pulse: 83  SpO2: 97%  Weight: 172 lb (78 kg)  Height: 5' 4.25" (1.632 m)   Body mass index is 29.29 kg/m.  General appearance:  Normal Thyroid:  Symmetrical, normal in size, without palpable masses or  nodularity. Respiratory  Auscultation:  Clear without wheezing or rhonchi Cardiovascular  Auscultation:  Regular rate, without rubs, murmurs or gallops  Edema/varicosities:  Not grossly evident Abdominal  Soft,nontender, without masses, guarding or rebound.  Liver/spleen:  No organomegaly noted  Hernia:  None appreciated  Skin  Inspection:  Grossly normal Breasts: Examined lying and sitting.   Right: Without masses, retractions, nipple discharge or axillary adenopathy.   Left: Without masses, retractions, nipple discharge or axillary adenopathy. Genitourinary   Inguinal/mons:  Normal without inguinal adenopathy  External genitalia:  Normal appearing vulva with no masses, tenderness, or lesions  BUS/Urethra/Skene's glands:  Normal  Vagina:  Normal appearing with normal color and discharge, no lesions  Cervix:  Normal appearing without discharge or lesions  Uterus:  Normal in size, shape and contour.  Midline and mobile, nontender  Adnexa/parametria:     Rt: Normal in size, without masses or tenderness.   Lt: Normal in size, without masses or tenderness.  Anus and perineum: Normal  Digital rectal exam: Not indicated  Patient informed chaperone available to be present for breast and pelvic exam. Patient has requested no chaperone to be present. Patient has been advised what will be completed during breast and pelvic exam.   Assessment/Plan:  31 y.o. G1P1001 to establish care.   Well female exam with routine gynecological exam - Plan: Cytology - PAP( [place]), CBC with Differential/Platelet, Comprehensive metabolic panel. Education provided on SBEs, importance of preventative screenings, current guidelines, high calcium diet, regular exercise, and multivitamin daily.   Pelvic pain - Plan: US PELVIS TRANSVAGINAL NON-OB (TV ONLY). She began having intermittent low abdominal pain back in November 2022, abdominal ultrasound normal, pelvic  ultrasound not done. Pain occurs most months  1-2 weeks after menses, is sharp, mostly left-sided, and can last hours to days. She recently was at [place] and had excruciating pain that radiated down left leg. Will schedule ultrasound.   Menorrhagia with irregular cycle - Plan: CBC with Differential/Platelet, TSH, US PELVIS TRANSVAGINAL NON-OB (TV ONLY). Menses have become somewhat irregular someimtes 1-2 weeks early, are very heavy with clots for 3-4 days, clots as big as palm of hand at times. Prior to this she had monthly cycles with moderate bleeding.   Screening for cervical cancer - Normal Pap history.  Pap today.   Return for ultrasound.     Olivia Mackie DNP, 3:11 PM 10/26/2021

## 2021-10-27 LAB — COMPREHENSIVE METABOLIC PANEL
AG Ratio: 2.1 (calc) (ref 1.0–2.5)
ALT: 22 U/L (ref 6–29)
AST: 18 U/L (ref 10–30)
Albumin: 4.5 g/dL (ref 3.6–5.1)
Alkaline phosphatase (APISO): 62 U/L (ref 31–125)
BUN: 10 mg/dL (ref 7–25)
CO2: 25 mmol/L (ref 20–32)
Calcium: 9.5 mg/dL (ref 8.6–10.2)
Chloride: 105 mmol/L (ref 98–110)
Creat: 0.74 mg/dL (ref 0.50–0.97)
Globulin: 2.1 g/dL (calc) (ref 1.9–3.7)
Glucose, Bld: 94 mg/dL (ref 65–99)
Potassium: 4.7 mmol/L (ref 3.5–5.3)
Sodium: 139 mmol/L (ref 135–146)
Total Bilirubin: 0.3 mg/dL (ref 0.2–1.2)
Total Protein: 6.6 g/dL (ref 6.1–8.1)

## 2021-10-27 LAB — CBC WITH DIFFERENTIAL/PLATELET
Absolute Monocytes: 456 cells/uL (ref 200–950)
Basophils Absolute: 69 cells/uL (ref 0–200)
Basophils Relative: 0.8 %
Eosinophils Absolute: 155 cells/uL (ref 15–500)
Eosinophils Relative: 1.8 %
HCT: 41.5 % (ref 35.0–45.0)
Hemoglobin: 13.9 g/dL (ref 11.7–15.5)
Lymphs Abs: 2408 cells/uL (ref 850–3900)
MCH: 30.3 pg (ref 27.0–33.0)
MCHC: 33.5 g/dL (ref 32.0–36.0)
MCV: 90.4 fL (ref 80.0–100.0)
MPV: 9.7 fL (ref 7.5–12.5)
Monocytes Relative: 5.3 %
Neutro Abs: 5513 cells/uL (ref 1500–7800)
Neutrophils Relative %: 64.1 %
Platelets: 321 10*3/uL (ref 140–400)
RBC: 4.59 10*6/uL (ref 3.80–5.10)
RDW: 12.6 % (ref 11.0–15.0)
Total Lymphocyte: 28 %
WBC: 8.6 10*3/uL (ref 3.8–10.8)

## 2021-10-27 LAB — TSH: TSH: 1.18 mIU/L

## 2021-10-30 LAB — CYTOLOGY - PAP
Comment: NEGATIVE
Diagnosis: NEGATIVE
High risk HPV: NEGATIVE

## 2021-11-22 ENCOUNTER — Other Ambulatory Visit: Payer: BC Managed Care – PPO

## 2021-11-22 ENCOUNTER — Ambulatory Visit (INDEPENDENT_AMBULATORY_CARE_PROVIDER_SITE_OTHER): Payer: BC Managed Care – PPO | Admitting: Nurse Practitioner

## 2021-11-22 ENCOUNTER — Encounter: Payer: Self-pay | Admitting: Nurse Practitioner

## 2021-11-22 ENCOUNTER — Ambulatory Visit (INDEPENDENT_AMBULATORY_CARE_PROVIDER_SITE_OTHER): Payer: BC Managed Care – PPO

## 2021-11-22 ENCOUNTER — Other Ambulatory Visit: Payer: BC Managed Care – PPO | Admitting: Nurse Practitioner

## 2021-11-22 VITALS — BP 110/82 | HR 101

## 2021-11-22 DIAGNOSIS — R1030 Lower abdominal pain, unspecified: Secondary | ICD-10-CM

## 2021-11-22 DIAGNOSIS — R102 Pelvic and perineal pain: Secondary | ICD-10-CM

## 2021-11-22 DIAGNOSIS — N921 Excessive and frequent menstruation with irregular cycle: Secondary | ICD-10-CM

## 2021-11-22 DIAGNOSIS — N83202 Unspecified ovarian cyst, left side: Secondary | ICD-10-CM

## 2021-11-22 NOTE — Progress Notes (Signed)
   Acute Office Visit  Subjective:    Patient ID: Destiny Friedman, female    DOB: [birth year], 31 y.o.   MRN: [id number]   HPI 31 y.o. presents today for ultrasound follow up. Seen 10/26/21 as new patient with complaints of having intermittent lower abdominal pain that started in November 2022, abdominal ultrasound normal, pelvic ultrasound not done. Pain occurs most months 1-2 weeks after menses, is sharp, mostly left-sided, and can last hours to days. She recently was at [place] and had excruciating pain that radiated down left leg. Most recently she has had right sided pain that radiates down leg. No numbness or tingling present. Menses have become somewhat irregular sometimes occurring 1-2 weeks early, are very heavy with clots for 3-4 days with clots as big as palm of hand at times. Prior to this she had monthly cycles with moderate bleeding. Reports most recent cycle was not as heavy or painful. H/O ovarian cyst, migraines without aura.    Review of Systems  Constitutional: Negative.   Genitourinary:  Positive for menstrual problem and pelvic pain.       Objective:    Physical Exam Constitutional:      Appearance: Normal appearance.   GU: not indicated  BP 110/82   Pulse (!) 101   LMP 11/14/2021 (Exact Date)   SpO2 99%  Wt Readings from Last 3 Encounters:  10/26/21 172 lb (78 kg)  11/13/16 158 lb (71.7 kg)        Assessment & Plan:   Problem List Items Addressed This Visit   None Visit Diagnoses     Menorrhagia with irregular cycle    -  Primary   Lower abdominal pain       Relevant Orders   US PELVIS TRANSVAGINAL NON-OB (TV ONLY)   Cyst of left ovary       Relevant Orders   US PELVIS TRANSVAGINAL NON-OB (TV ONLY)      Vaginal U/S: Anteverted uterus, normal size and shape, no myometrial masses.  Symmetrical endometrium measuring approximately 4.4 mm.  No obvious masses or thickening seen.  Both ovaries mobile, normal size with normal follicle pattern and  normal perfusion.  Left ovary with a 2.3 x 1.9 cm cystic structure with debris. Small amount of free fluid in cul-de-sac.  Very tender vaginal exam bilaterally with palpation.  Plan: Ultrasound thoroughly reviewed with patient. Small left ovarian cystic structure with debris. Repeat ultrasound in 6-8 weeks. Pain is not always on the left, so may not be cause for discomfort. Discussed management of heavy, painful menses with hormonal contraception. She is nervous about starting birth control. She is considering COCs and Mirena IUD. She will call if she decides to proceed with either. Recommend Ibuprofen 600-800 mg every 8 hours as needed for cramping and to decease blood flow by up to 40%. All questions answered.       Olivia Mackie DNP, 10:00 AM 11/22/2021

## 2022-01-18 ENCOUNTER — Other Ambulatory Visit: Payer: BC Managed Care – PPO | Admitting: Nurse Practitioner

## 2022-01-18 ENCOUNTER — Other Ambulatory Visit: Payer: BC Managed Care – PPO

## 2022-01-18 DIAGNOSIS — Z0289 Encounter for other administrative examinations: Secondary | ICD-10-CM

## 2022-01-18 NOTE — Progress Notes (Deleted)
   Acute Office Visit  Subjective:    Patient ID: Destiny Friedman, female    DOB: 01-Nov-1990, 31 y.o.   MRN: [id number]   HPI 31 y.o. presents today for ***   Review of Systems     Objective:    Physical Exam  There were no vitals taken for this visit. Wt Readings from Last 3 Encounters:  10/26/21 172 lb (78 kg)  11/13/16 158 lb (71.7 kg)        Patient informed chaperone available to be present for breast and/or pelvic exam. Patient has requested no chaperone to be present. Patient has been advised what will be completed during breast and pelvic exam.   Assessment & Plan:   Problem List Items Addressed This Visit   None       Tamela Gammon DNP, 7:54 AM 01/18/2022

## 2022-01-26 ENCOUNTER — Encounter: Payer: Self-pay | Admitting: Nurse Practitioner

## 2022-02-05 ENCOUNTER — Telehealth: Payer: Self-pay | Admitting: Nurse Practitioner

## 2022-02-05 NOTE — Telephone Encounter (Signed)
Patient no show for her October 19 ultrasound appointment. Message was left on October 23 and 25 and letter mailed on October 27 to call and reschedule missed appointment. Patient has not rescheduled appointment.

## 2022-02-27 NOTE — Telephone Encounter (Signed)
Spoke with patient.  F/U PUS recommended from last OV on 11/22/21 for lower abd pain and left ovarian cyst. Repeat 6-8 weeks.   Patient declines to schedule, request to review benefits prior to scheduling. Advised I will forward to business office for return call. Patient agreeable.   Routing to [employer].

## 2022-04-16 NOTE — Telephone Encounter (Signed)
Destiny Friedman  You1 month ago   DM LM for patient to call to discuss.    No return call from patient.   Tiffany please advise.

## 2022-05-07 NOTE — Telephone Encounter (Signed)
Destiny Friedman -did patient return call to discuss options?

## 2022-05-25 NOTE — Telephone Encounter (Signed)
No response from patient.  Letter pended, copy to Tiffany to review.

## 2022-05-29 NOTE — Telephone Encounter (Signed)
Letter reviewed and signed by Tiffany.  Mailed to address on file.  Encounter closed.

## 2022-08-01 DIAGNOSIS — F419 Anxiety disorder, unspecified: Secondary | ICD-10-CM | POA: Diagnosis not present

## 2022-08-01 DIAGNOSIS — Z6830 Body mass index (BMI) 30.0-30.9, adult: Secondary | ICD-10-CM | POA: Diagnosis not present

## 2022-08-01 DIAGNOSIS — G43909 Migraine, unspecified, not intractable, without status migrainosus: Secondary | ICD-10-CM | POA: Diagnosis not present

## 2022-08-01 DIAGNOSIS — Z136 Encounter for screening for cardiovascular disorders: Secondary | ICD-10-CM | POA: Diagnosis not present

## 2022-08-01 DIAGNOSIS — R002 Palpitations: Secondary | ICD-10-CM | POA: Diagnosis not present

## 2022-08-01 DIAGNOSIS — E559 Vitamin D deficiency, unspecified: Secondary | ICD-10-CM | POA: Diagnosis not present

## 2022-08-01 DIAGNOSIS — D519 Vitamin B12 deficiency anemia, unspecified: Secondary | ICD-10-CM | POA: Diagnosis not present

## 2023-08-21 DIAGNOSIS — R1013 Epigastric pain: Secondary | ICD-10-CM | POA: Diagnosis not present

## 2023-08-21 DIAGNOSIS — F411 Generalized anxiety disorder: Secondary | ICD-10-CM | POA: Diagnosis not present

## 2023-08-21 DIAGNOSIS — R051 Acute cough: Secondary | ICD-10-CM | POA: Diagnosis not present

## 2023-08-21 DIAGNOSIS — R112 Nausea with vomiting, unspecified: Secondary | ICD-10-CM | POA: Diagnosis not present

## 2023-08-21 DIAGNOSIS — R634 Abnormal weight loss: Secondary | ICD-10-CM | POA: Diagnosis not present

## 2023-08-21 DIAGNOSIS — Z131 Encounter for screening for diabetes mellitus: Secondary | ICD-10-CM | POA: Diagnosis not present

## 2023-08-21 DIAGNOSIS — J019 Acute sinusitis, unspecified: Secondary | ICD-10-CM | POA: Diagnosis not present

## 2023-08-28 DIAGNOSIS — F321 Major depressive disorder, single episode, moderate: Secondary | ICD-10-CM | POA: Diagnosis not present

## 2023-08-28 DIAGNOSIS — R7989 Other specified abnormal findings of blood chemistry: Secondary | ICD-10-CM | POA: Diagnosis not present

## 2023-08-28 DIAGNOSIS — K219 Gastro-esophageal reflux disease without esophagitis: Secondary | ICD-10-CM | POA: Diagnosis not present

## 2023-08-28 DIAGNOSIS — F411 Generalized anxiety disorder: Secondary | ICD-10-CM | POA: Diagnosis not present

## 2023-09-19 DIAGNOSIS — F411 Generalized anxiety disorder: Secondary | ICD-10-CM | POA: Diagnosis not present

## 2023-09-19 DIAGNOSIS — F321 Major depressive disorder, single episode, moderate: Secondary | ICD-10-CM | POA: Diagnosis not present

## 2023-11-11 ENCOUNTER — Telehealth: Payer: Self-pay

## 2023-11-11 NOTE — Telephone Encounter (Signed)
 Patient called stating that she started her cycle. She wanted to know if she should reschedule appointment. Call placed to patient but unable to leave a message due to full mailbox.

## 2023-11-11 NOTE — Telephone Encounter (Signed)
Spoke with patient. She will keep appointment for tomorrow.

## 2023-11-12 ENCOUNTER — Encounter: Payer: Self-pay | Admitting: Nurse Practitioner

## 2023-11-12 ENCOUNTER — Ambulatory Visit (INDEPENDENT_AMBULATORY_CARE_PROVIDER_SITE_OTHER): Admitting: Nurse Practitioner

## 2023-11-12 VITALS — BP 110/68 | HR 92

## 2023-11-12 DIAGNOSIS — F439 Reaction to severe stress, unspecified: Secondary | ICD-10-CM

## 2023-11-12 DIAGNOSIS — L659 Nonscarring hair loss, unspecified: Secondary | ICD-10-CM | POA: Diagnosis not present

## 2023-11-12 DIAGNOSIS — N926 Irregular menstruation, unspecified: Secondary | ICD-10-CM | POA: Diagnosis not present

## 2023-11-12 DIAGNOSIS — R1032 Left lower quadrant pain: Secondary | ICD-10-CM | POA: Diagnosis not present

## 2023-11-12 DIAGNOSIS — N898 Other specified noninflammatory disorders of vagina: Secondary | ICD-10-CM

## 2023-11-12 DIAGNOSIS — Z113 Encounter for screening for infections with a predominantly sexual mode of transmission: Secondary | ICD-10-CM | POA: Diagnosis not present

## 2023-11-12 DIAGNOSIS — Z8742 Personal history of other diseases of the female genital tract: Secondary | ICD-10-CM | POA: Diagnosis not present

## 2023-11-12 LAB — PREGNANCY, URINE: Preg Test, Ur: NEGATIVE

## 2023-11-12 NOTE — Progress Notes (Signed)
 Acute Office Visit  Subjective:    Patient ID: Destiny Friedman, female    DOB: 1991-01-10, 33 y.o.   MRN: [id number]   HPI 33 y.o. G1P1001 presents today for menstrual changes. H/O irregular periods. Has noticed menses have been more frequent since earlier this year. Has had increased stress since end of last year due to finding out about husband's infidelity. Has had weight loss, skin changes, sleep disturbance, increased anxiety and hair loss. Finds herself waking up with a racing heart sometimes. Worried her hormones are off. In June around mid cycle she experienced severe LLQ pain that lasted about 10 minutes that was accompanied by sweating and nausea. Had spotting after that. Had similar pain in July but not as bad. H/O ovarian cysts - most recently seen on US  10/2021 - 2.3 x 1.9 cm cyst with debris. 6-8 month repeat ultrasound recommended but not completed by patient. Sexually active with husband. Has not had STD screening since affair. Denies discharge or odor but does have occasional itching. Having menstrual like bleeding since 8/9. Normal TSH a couple of months ago. Mother with history of thyroid disease.  Patient's last menstrual period was 11/09/2023 (exact date). Period Duration (Days): 7 Period Pattern: (!) Irregular Menstrual Flow: Heavy Menstrual Control: Maxi pad, Tampon Dysmenorrhea:  (varies with cramping, nausea, diarrhea & headaches)  Review of Systems  Constitutional:  Positive for fatigue.  Gastrointestinal:  Positive for abdominal pain (LLQ). Negative for constipation and diarrhea.  Endocrine: Negative for cold intolerance and heat intolerance.  Genitourinary:  Positive for menstrual problem and vaginal pain (Itching). Negative for dysuria, frequency, hematuria, urgency and vaginal discharge.  Skin:        Hair loss  Psychiatric/Behavioral:  Positive for sleep disturbance.        Objective:    Physical Exam Constitutional:      Appearance: Normal appearance.   Genitourinary:    General: Normal vulva.     Vagina: Normal.     Cervix: Normal.     Uterus: Normal.      Adnexa: Right adnexa normal and left adnexa normal.       Right: No tenderness.         Left: No tenderness.       BP 110/68   Pulse 92   LMP 11/09/2023 (Exact Date)   SpO2 99%  Wt Readings from Last 3 Encounters:  10/26/21 172 lb (78 kg)  11/13/16 158 lb (71.7 kg)         Assessment & Plan:   Problem List Items Addressed This Visit   None Visit Diagnoses       Irregular periods/menstrual cycles    -  Primary   Relevant Orders   Pregnancy, urine   US  PELVIS TRANSVAGINAL NON-OB (TV ONLY)   Estradiol    Follicle stimulating hormone   Luteinizing hormone   TSH     Stress         History of ovarian cyst       Relevant Orders   US  PELVIS TRANSVAGINAL NON-OB (TV ONLY)     Left lower quadrant abdominal pain       Relevant Orders   US  PELVIS TRANSVAGINAL NON-OB (TV ONLY)     Vaginal itching       Relevant Orders   SureSwab Advanced Vaginitis Plus,TMA     Screening examination for STD (sexually transmitted disease)       Relevant Orders   SureSwab Advanced Vaginitis Plus,TMA   HIV Antibody (  routine testing w rflx)   RPR     Hair loss       Relevant Orders   VITAMIN D  25 Hydroxy (Vit-D Deficiency, Fractures)   CBC with Differential/Platelet   Comprehensive metabolic panel with GFR   TSH      Plan: Schedule ultrasound. Vaginitis/STD panel pending. Labs today. Discussed signs and symptoms secondary to high levels of stress as these can be causing many of her symptoms.   Follow up to be determined.     Destiny DELENA Shutter DNP, 2:27 PM 11/12/2023

## 2023-11-13 LAB — TSH: TSH: 1.07 m[IU]/L

## 2023-11-13 LAB — SURESWAB® ADVANCED VAGINITIS PLUS,TMA
C. trachomatis RNA, TMA: NOT DETECTED
CANDIDA SPECIES: NOT DETECTED
Candida glabrata: NOT DETECTED
N. gonorrhoeae RNA, TMA: NOT DETECTED
SURESWAB(R) ADV BACTERIAL VAGINOSIS(BV),TMA: POSITIVE — AB
TRICHOMONAS VAGINALIS (TV),TMA: NOT DETECTED

## 2023-11-14 ENCOUNTER — Other Ambulatory Visit: Payer: Self-pay | Admitting: Nurse Practitioner

## 2023-11-14 ENCOUNTER — Ambulatory Visit: Payer: Self-pay | Admitting: Nurse Practitioner

## 2023-11-14 DIAGNOSIS — B9689 Other specified bacterial agents as the cause of diseases classified elsewhere: Secondary | ICD-10-CM

## 2023-11-14 LAB — COMPREHENSIVE METABOLIC PANEL WITH GFR
AG Ratio: 2.5 (calc) (ref 1.0–2.5)
ALT: 7 U/L (ref 6–29)
AST: 12 U/L (ref 10–30)
Albumin: 4.9 g/dL (ref 3.6–5.1)
Alkaline phosphatase (APISO): 40 U/L (ref 31–125)
BUN/Creatinine Ratio: 8 (calc) (ref 6–22)
BUN: 5 mg/dL — ABNORMAL LOW (ref 7–25)
CO2: 24 mmol/L (ref 20–32)
Calcium: 9.5 mg/dL (ref 8.6–10.2)
Chloride: 104 mmol/L (ref 98–110)
Creat: 0.63 mg/dL (ref 0.50–0.97)
Globulin: 2 g/dL (ref 1.9–3.7)
Glucose, Bld: 78 mg/dL (ref 65–99)
Potassium: 4 mmol/L (ref 3.5–5.3)
Sodium: 138 mmol/L (ref 135–146)
Total Bilirubin: 0.6 mg/dL (ref 0.2–1.2)
Total Protein: 6.9 g/dL (ref 6.1–8.1)
eGFR: 119 mL/min/1.73m2 (ref >=60)

## 2023-11-14 LAB — CBC WITH DIFFERENTIAL/PLATELET
Absolute Lymphocytes: 1503 {cells}/uL (ref 850–3900)
Absolute Monocytes: 367 {cells}/uL (ref 200–950)
Basophils Absolute: 48 {cells}/uL (ref 0–200)
Basophils Relative: 0.7 %
Eosinophils Absolute: 122 {cells}/uL (ref 15–500)
Eosinophils Relative: 1.8 %
HCT: 42.7 % (ref 35.0–45.0)
Hemoglobin: 14.3 g/dL (ref 11.7–15.5)
MCH: 31 pg (ref 27.0–33.0)
MCHC: 33.5 g/dL (ref 32.0–36.0)
MCV: 92.4 fL (ref 80.0–100.0)
MPV: 9.8 fL (ref 7.5–12.5)
Monocytes Relative: 5.4 %
Neutro Abs: 4760 {cells}/uL (ref 1500–7800)
Neutrophils Relative %: 70 %
Platelets: 320 Thousand/uL (ref 140–400)
RBC: 4.62 Million/uL (ref 3.80–5.10)
RDW: 12.3 % (ref 11.0–15.0)
Total Lymphocyte: 22.1 %
WBC: 6.8 Thousand/uL (ref 3.8–10.8)

## 2023-11-14 LAB — HIV ANTIBODY (ROUTINE TESTING W REFLEX): HIV 1&2 Ab, 4th Generation: NONREACTIVE

## 2023-11-14 LAB — ESTRADIOL: Estradiol: 38 pg/mL

## 2023-11-14 LAB — RPR: RPR Ser Ql: NONREACTIVE

## 2023-11-14 LAB — FOLLICLE STIMULATING HORMONE: FSH: 5.6 m[IU]/mL

## 2023-11-14 LAB — LUTEINIZING HORMONE: LH: 8.8 m[IU]/mL

## 2023-11-14 LAB — VITAMIN D 25 HYDROXY (VIT D DEFICIENCY, FRACTURES): Vit D, 25-Hydroxy: 31 ng/mL (ref 30–100)

## 2023-11-14 MED ORDER — METRONIDAZOLE 500 MG PO TABS: 500.0000 mg | ORAL_TABLET | Freq: Two times a day (BID) | ORAL | 0 refills | Status: DC

## 2023-11-18 NOTE — Telephone Encounter (Signed)
 Patient aware of results and voiced understanding.

## 2023-11-26 ENCOUNTER — Ambulatory Visit (INDEPENDENT_AMBULATORY_CARE_PROVIDER_SITE_OTHER): Admitting: Nurse Practitioner

## 2023-11-26 ENCOUNTER — Ambulatory Visit (INDEPENDENT_AMBULATORY_CARE_PROVIDER_SITE_OTHER)

## 2023-11-26 VITALS — BP 102/60 | HR 55 | Wt 129.4 lb

## 2023-11-26 DIAGNOSIS — R1032 Left lower quadrant pain: Secondary | ICD-10-CM

## 2023-11-26 DIAGNOSIS — N926 Irregular menstruation, unspecified: Secondary | ICD-10-CM

## 2023-11-26 DIAGNOSIS — F419 Anxiety disorder, unspecified: Secondary | ICD-10-CM | POA: Diagnosis not present

## 2023-11-26 DIAGNOSIS — Z8742 Personal history of other diseases of the female genital tract: Secondary | ICD-10-CM

## 2023-11-26 DIAGNOSIS — N83209 Unspecified ovarian cyst, unspecified side: Secondary | ICD-10-CM

## 2023-11-26 NOTE — Progress Notes (Signed)
   Acute Office Visit  Subjective:    Patient ID: Destiny Friedman, female    DOB: 03-09-1991, 33 y.o.   MRN: [id number]   HPI 33 y.o. presents today for ultrasound. Seen 11/12/23 with complaints of menstrual changes. H/O irregular periods. Has noticed menses have been more frequent since earlier this year. Has had increased stress since end of last year due to finding out about husband's infidelity. Has had weight loss, skin changes, sleep disturbance, increased anxiety and hair loss. Finds herself waking up with a racing heart sometimes. Worried her hormones are off. In June around mid cycle she experienced severe LLQ pain that lasted about 10 minutes that was accompanied by sweating and nausea. Had spotting after that. Had similar pain in July but not as bad. H/O ovarian cysts - most recently seen on US  10/2021 - 2.3 x 1.9 cm cyst with debris. Treated for BV 8/14. Normal labs. Has tried anti-anxiety meds but did not do well with them. Using behavorial practices of sleeping well, limiting caffeine, exercising, drinking tea, and other self care behaviors. Was on BC years ago. Hesitant to start again.   Patient's last menstrual period was 11/09/2023 (within days). Period Duration (Days): 7 Period Pattern: (!) Irregular Menstrual Flow: Heavy, Moderate Menstrual Control: Panty liner, Thin pad, Maxi pad, Tampon Dysmenorrhea: (!) Severe Dysmenorrhea Symptoms: Cramping, Nausea, Diarrhea, Headache  Review of Systems  Constitutional: Negative.   Genitourinary:  Positive for menstrual problem and pelvic pain.  Psychiatric/Behavioral:  The patient is nervous/anxious.        Objective:    Physical Exam Constitutional:      Appearance: Normal appearance.   GU: Not indicated  BP 102/60   Pulse (!) 55   Wt 129 lb 6.4 oz (58.7 kg)   LMP 11/09/2023 (Within Days)   SpO2 92%   BMI 22.04 kg/m  Wt Readings from Last 3 Encounters:  11/26/23 129 lb 6.4 oz (58.7 kg)  10/26/21 172 lb (78 kg)   11/13/16 158 lb (71.7 kg)      Assessment & Plan:   Problem List Items Addressed This Visit   None Visit Diagnoses       Hemorrhagic cyst of ovary    -  Primary     Irregular periods/menstrual cycles         Anxiety          Vaginal ultrasound (comparison is made to 2023 ultrasound): Anteverted uterus, normal size and shape, subcentimeter anterior intramural fibroid noted. Symmetrical endometrium -12.56 mm (cycle day 18).  No masses seen, avascular.  Both ovaries with positive perfusion.  Bilateral hemorrhagic cysts.  Right ovary 30 mm, left ovary 16 mm. No adnexal masses, no free fluid.  Plan: Discussed ultrasound findings of small bilateral hemorrhagia cysts. Recommend US  in 6-8 weeks to follow up. Not cause for irregular periods. Considering hormonal birth control for cycle regulation and cyst suppression. Will discuss at follow up visit. Declines anti-anxiety medication and will continue current coping practices.       Destiny DELENA Shutter DNP, 8:48 AM 11/26/2023

## 2023-11-27 ENCOUNTER — Other Ambulatory Visit: Payer: Self-pay | Admitting: Nurse Practitioner

## 2023-11-27 DIAGNOSIS — N83209 Unspecified ovarian cyst, unspecified side: Secondary | ICD-10-CM

## 2023-11-28 ENCOUNTER — Encounter: Payer: Self-pay | Admitting: Obstetrics and Gynecology

## 2023-11-28 ENCOUNTER — Ambulatory Visit (INDEPENDENT_AMBULATORY_CARE_PROVIDER_SITE_OTHER): Admitting: Obstetrics and Gynecology

## 2023-11-28 ENCOUNTER — Ambulatory Visit: Payer: Self-pay | Admitting: Obstetrics and Gynecology

## 2023-11-28 VITALS — BP 98/62 | HR 95 | Temp 98.3°F | Ht 64.5 in | Wt 130.0 lb

## 2023-11-28 DIAGNOSIS — R32 Unspecified urinary incontinence: Secondary | ICD-10-CM | POA: Diagnosis not present

## 2023-11-28 DIAGNOSIS — R300 Dysuria: Secondary | ICD-10-CM | POA: Diagnosis not present

## 2023-11-28 DIAGNOSIS — B3731 Acute candidiasis of vulva and vagina: Secondary | ICD-10-CM | POA: Diagnosis not present

## 2023-11-28 MED ORDER — FLUCONAZOLE 150 MG PO TABS: 150.0000 mg | ORAL_TABLET | Freq: Once | ORAL | 1 refills | Status: AC

## 2023-11-28 NOTE — Progress Notes (Signed)
   33 y.o. G27P1001 female here for vaginal symptoms. Married.  Patient's last menstrual period was 11/09/2023.   She reports urinary symptoms of nausea and burning with urination, vaginal irritation. Recently treated for BV and finished antibiotics on Tues this week. . Symptoms have been present for 2 days. She has not tried anything over the counter. Urine sample provided: Yes  Birth control: None Sexually active: Yes    OB History  Gravida Para Term Preterm AB Living  1 1 1   1   SAB IAB Ectopic Multiple Live Births      1    # Outcome Date GA Lbr Len/2nd Weight Sex Type Anes PTL Lv  1 Term      Vag-Spont   LIV   Past Medical History:  Diagnosis Date   Anxiety    Asthma    Migraines    Past Surgical History:  Procedure Laterality Date   TONSILLECTOMY     Current Outpatient Medications on File Prior to Visit  Medication Sig Dispense Refill   omeprazole (PRILOSEC) 20 MG capsule Take 20 mg by mouth daily.     UBRELVY 100 MG TABS TAKE 1 TABLET EVERY OTHER DAY BY ORAL ROUTE AS NEEDED FOR 30 DAYS, FOR AS NEEDED FOR MIGRAINES..     VITAMIN D  PO Take by mouth.     metroNIDAZOLE  (FLAGYL ) 500 MG tablet Take 1 tablet (500 mg total) by mouth 2 (two) times daily. 14 tablet 0   No current facility-administered medications on file prior to visit.   Allergies  Allergen Reactions   Percocet [Oxycodone-Acetaminophen] Itching and Nausea And Vomiting      PE Today's Vitals   11/28/23 1511  BP: 98/62  Pulse: 95  Temp: 98.3 F (36.8 C)  TempSrc: Oral  SpO2: 98%  Weight: 130 lb (59 kg)  Height: 5' 4.5 (1.638 m)   Body mass index is 21.97 kg/m.  Physical Exam Vitals reviewed.  Constitutional:      General: She is not in acute distress.    Appearance: Normal appearance.  HENT:     Head: Normocephalic and atraumatic.     Nose: Nose normal.  Eyes:     Extraocular Movements: Extraocular movements intact.     Conjunctiva/sclera: Conjunctivae normal.  Pulmonary:      Effort: Pulmonary effort is normal.  Genitourinary:    Comments: Deferred given yeast noted on UA and patient discomfort Musculoskeletal:        General: Normal range of motion.     Cervical back: Normal range of motion.  Neurological:     General: No focal deficit present.     Mental Status: She is alert.  Psychiatric:        Mood and Affect: Mood normal.        Behavior: Behavior normal.      Assessment and Plan:        Dysuria -     Urinalysis,Complete w/RFL Culture  Yeast vaginitis -     Fluconazole ; Take 1 tablet (150 mg total) by mouth once for 1 dose.  Dispense: 1 tablet; Refill: 1    Vera LULLA Pa, MD

## 2023-11-30 LAB — URINALYSIS, COMPLETE W/RFL CULTURE
Bacteria, UA: NONE SEEN /HPF
Bilirubin Urine: NEGATIVE
Glucose, UA: NEGATIVE
Hgb urine dipstick: NEGATIVE
Hyaline Cast: NONE SEEN /LPF
Ketones, ur: NEGATIVE
Nitrites, Initial: NEGATIVE
Protein, ur: NEGATIVE
Specific Gravity, Urine: 1.002 (ref 1.001–1.035)
pH: 6 (ref 5.0–8.0)

## 2023-11-30 LAB — URINE CULTURE
MICRO NUMBER:: 16896503
Result:: NO GROWTH
SPECIMEN QUALITY:: ADEQUATE

## 2023-11-30 LAB — CULTURE INDICATED

## 2023-12-30 NOTE — Progress Notes (Unsigned)
   Destiny Friedman [birth year] [id number]   History:  33 y.o. G1P1001 presents for annual exam. Seen last month for menstrual changes. Has had significant stress this past year, increased anxiety. Menses are occurring more frequently. Normal TSH. US  11/28/23 showed small bilateral hemorrhagic cysts, follow up US  scheduled 10/8. Normal pap history. H/O ovarian cysts, migraines.   Gynecologic History No LMP recorded.   Contraception/Family planning: none Sexually active: Yes  Health Maintenance Last Pap: 10/26/2021. Results were: Normal neg HPV Last mammogram: Not indicated Last colonoscopy: Not indicated Last Dexa: Not indicated       No data to display           Past medical history, past surgical history, family history and social history were all reviewed and documented in the EPIC chart.  ROS:  A ROS was performed and pertinent positives and negatives are included.  Exam:  There were no vitals filed for this visit. There is no height or weight on file to calculate BMI.  General appearance:  Normal Thyroid:  Symmetrical, normal in size, without palpable masses or nodularity. Respiratory  Auscultation:  Clear without wheezing or rhonchi Cardiovascular  Auscultation:  Regular rate, without rubs, murmurs or gallops  Edema/varicosities:  Not grossly evident Abdominal  Soft,nontender, without masses, guarding or rebound.  Liver/spleen:  No organomegaly noted  Hernia:  None appreciated  Skin  Inspection:  Grossly normal Breasts: Examined lying and sitting.   Right: Without masses, retractions, nipple discharge or axillary adenopathy.   Left: Without masses, retractions, nipple discharge or axillary adenopathy. Pelvic: External genitalia:  no lesions              Urethra:  normal appearing urethra with no masses, tenderness or lesions              Bartholins and Skenes: normal                 Vagina: normal appearing vagina with normal color and discharge, no lesions               Cervix: no lesions Bimanual Exam:  Uterus:  no masses or tenderness              Adnexa: no mass, fullness, tenderness              Rectovaginal: Deferred              Anus:  normal, no lesions  Zada Louder, CMA present as chaperone.   Assessment/Plan:  33 y.o. G1P1001 for annual exam.    No follow-ups on file.   Annabella DELENA Shutter DNP, 3:23 PM 12/30/2023

## 2023-12-31 ENCOUNTER — Encounter: Payer: Self-pay | Admitting: Nurse Practitioner

## 2023-12-31 ENCOUNTER — Ambulatory Visit: Admitting: Nurse Practitioner

## 2023-12-31 VITALS — BP 102/70 | HR 90 | Ht 64.0 in | Wt 129.0 lb

## 2023-12-31 DIAGNOSIS — N83209 Unspecified ovarian cyst, unspecified side: Secondary | ICD-10-CM

## 2023-12-31 DIAGNOSIS — N926 Irregular menstruation, unspecified: Secondary | ICD-10-CM | POA: Diagnosis not present

## 2023-12-31 DIAGNOSIS — Z01419 Encounter for gynecological examination (general) (routine) without abnormal findings: Secondary | ICD-10-CM | POA: Diagnosis not present

## 2023-12-31 DIAGNOSIS — Z1331 Encounter for screening for depression: Secondary | ICD-10-CM

## 2024-01-07 NOTE — Progress Notes (Unsigned)
   Acute Office Visit  Subjective:    Patient ID: Destiny Friedman, female    DOB: 1990/07/17, 33 y.o.   MRN: [id number]   HPI 33 y.o. presents today for ultrasound. 6-week follow up on bilateral hemorrhagic cysts.  Vaginal ultrasound (comparison is made to 2023 ultrasound): Anteverted uterus, normal size and shape, subcentimeter anterior intramural fibroid noted. Symmetrical endometrium -12.56 mm (cycle day 18).  No masses seen, avascular.  Both ovaries with positive perfusion.  Bilateral hemorrhagic cysts.  Right ovary 30 mm, left ovary 16 mm. No adnexal masses, no free fluid.  Patient's last menstrual period was 12/09/2023 (approximate).    Review of Systems     Objective:    Physical Exam  LMP 12/09/2023 (Approximate)  Wt Readings from Last 3 Encounters:  12/31/23 129 lb (58.5 kg)  11/28/23 130 lb (59 kg)  11/26/23 129 lb 6.4 oz (58.7 kg)        Zada Louder, CMA present as Biomedical engineer.   Assessment & Plan:   Problem List Items Addressed This Visit   None   No follow-ups on file.    Annabella DELENA Shutter DNP, 10:57 AM 01/07/2024

## 2024-01-08 ENCOUNTER — Other Ambulatory Visit

## 2024-01-08 ENCOUNTER — Encounter: Payer: Self-pay | Admitting: Nurse Practitioner

## 2024-01-08 ENCOUNTER — Ambulatory Visit (INDEPENDENT_AMBULATORY_CARE_PROVIDER_SITE_OTHER): Admitting: Nurse Practitioner

## 2024-01-08 VITALS — BP 108/68 | HR 70 | Resp 16

## 2024-01-08 DIAGNOSIS — N926 Irregular menstruation, unspecified: Secondary | ICD-10-CM

## 2024-01-08 DIAGNOSIS — N83209 Unspecified ovarian cyst, unspecified side: Secondary | ICD-10-CM | POA: Diagnosis not present

## 2024-02-05 DIAGNOSIS — R221 Localized swelling, mass and lump, neck: Secondary | ICD-10-CM | POA: Diagnosis not present
